# Patient Record
Sex: Male | Born: 1965 | Race: Black or African American | Hispanic: No | Marital: Single
Health system: Southern US, Community
[De-identification: ages and names within clinical notes are randomized; demographics above are authoritative.]

## PROBLEM LIST (undated history)

## (undated) DIAGNOSIS — I10 Essential (primary) hypertension: Secondary | ICD-10-CM

## (undated) DIAGNOSIS — Z950 Presence of cardiac pacemaker: Secondary | ICD-10-CM

---

## 2006-09-30 ENCOUNTER — Observation Stay (HOSPITAL_COMMUNITY): Admission: EM | Admit: 2006-09-30 | Discharge: 2006-10-01 | Payer: Self-pay | Admitting: Emergency Medicine

## 2006-09-30 ENCOUNTER — Ambulatory Visit: Payer: Self-pay | Admitting: Cardiology

## 2006-10-08 ENCOUNTER — Ambulatory Visit: Payer: Self-pay | Admitting: *Deleted

## 2006-10-08 ENCOUNTER — Ambulatory Visit: Payer: Self-pay | Admitting: Internal Medicine

## 2007-10-11 ENCOUNTER — Emergency Department (HOSPITAL_COMMUNITY): Admission: EM | Admit: 2007-10-11 | Discharge: 2007-10-11 | Payer: Self-pay | Admitting: Emergency Medicine

## 2007-10-11 ENCOUNTER — Ambulatory Visit: Payer: Self-pay | Admitting: Internal Medicine

## 2008-03-10 ENCOUNTER — Encounter (INDEPENDENT_AMBULATORY_CARE_PROVIDER_SITE_OTHER): Payer: Self-pay | Admitting: Internal Medicine

## 2008-04-21 ENCOUNTER — Encounter (INDEPENDENT_AMBULATORY_CARE_PROVIDER_SITE_OTHER): Payer: Self-pay | Admitting: Internal Medicine

## 2008-05-10 ENCOUNTER — Observation Stay (HOSPITAL_COMMUNITY): Admission: EM | Admit: 2008-05-10 | Discharge: 2008-05-11 | Payer: Self-pay | Admitting: Emergency Medicine

## 2008-06-15 ENCOUNTER — Emergency Department (HOSPITAL_COMMUNITY): Admission: EM | Admit: 2008-06-15 | Discharge: 2008-06-15 | Payer: Self-pay | Admitting: Family Medicine

## 2008-06-15 ENCOUNTER — Telehealth (INDEPENDENT_AMBULATORY_CARE_PROVIDER_SITE_OTHER): Payer: Self-pay | Admitting: Internal Medicine

## 2008-07-13 ENCOUNTER — Other Ambulatory Visit: Payer: Self-pay | Admitting: Emergency Medicine

## 2008-07-14 ENCOUNTER — Ambulatory Visit: Payer: Self-pay | Admitting: Psychiatry

## 2008-07-14 ENCOUNTER — Inpatient Hospital Stay (HOSPITAL_COMMUNITY): Admission: AD | Admit: 2008-07-14 | Discharge: 2008-07-25 | Payer: Self-pay | Admitting: Psychiatry

## 2008-08-16 ENCOUNTER — Ambulatory Visit: Payer: Self-pay | Admitting: Internal Medicine

## 2008-08-16 DIAGNOSIS — F200 Paranoid schizophrenia: Secondary | ICD-10-CM | POA: Insufficient documentation

## 2008-08-16 DIAGNOSIS — I10 Essential (primary) hypertension: Secondary | ICD-10-CM | POA: Insufficient documentation

## 2008-09-12 ENCOUNTER — Encounter (INDEPENDENT_AMBULATORY_CARE_PROVIDER_SITE_OTHER): Payer: Self-pay | Admitting: Internal Medicine

## 2008-10-03 ENCOUNTER — Ambulatory Visit: Payer: Self-pay | Admitting: Internal Medicine

## 2008-10-03 LAB — CONVERTED CEMR LAB
ALT: 38 units/L (ref 0–53)
AST: 32 units/L (ref 0–37)
Alkaline Phosphatase: 63 units/L (ref 39–117)
BUN: 9 mg/dL (ref 6–23)
Basophils Relative: 0 % (ref 0–1)
Blood in Urine, dipstick: NEGATIVE
CO2: 23 meq/L (ref 19–32)
Chloride: 103 meq/L (ref 96–112)
Cholesterol: 189 mg/dL (ref 0–200)
Glucose, Bld: 89 mg/dL (ref 70–99)
HCT: 44.2 % (ref 39.0–52.0)
HDL: 48 mg/dL (ref >39)
Ketones, urine, test strip: NEGATIVE
Lymphocytes Relative: 59 % — ABNORMAL HIGH (ref 12–46)
MCV: 89.3 fL (ref 78.0–100.0)
Monocytes Relative: 9 % (ref 3–12)
Neutro Abs: 1.5 10*3/uL — ABNORMAL LOW (ref 1.7–7.7)
Nitrite: NEGATIVE
Platelets: 208 10*3/uL (ref 150–400)
Protein, U semiquant: NEGATIVE
RBC: 4.95 M/uL (ref 4.22–5.81)
Sodium: 140 meq/L (ref 135–145)
Total CHOL/HDL Ratio: 3.9
Total Protein: 7.6 g/dL (ref 6.0–8.3)
Triglycerides: 104 mg/dL (ref <150)
WBC: 5.1 10*3/uL (ref 4.0–10.5)
pH: 6.5

## 2008-10-05 ENCOUNTER — Ambulatory Visit: Payer: Self-pay | Admitting: Internal Medicine

## 2008-10-05 LAB — CONVERTED CEMR LAB
Glucose, Urine, Semiquant: NEGATIVE
Nitrite: NEGATIVE
PSA: 0.19 ng/mL (ref 0.10–4.00)
Protein, U semiquant: NEGATIVE
Specific Gravity, Urine: 1.01
Urobilinogen, UA: 1

## 2008-10-12 ENCOUNTER — Encounter (INDEPENDENT_AMBULATORY_CARE_PROVIDER_SITE_OTHER): Payer: Self-pay | Admitting: Internal Medicine

## 2009-09-08 ENCOUNTER — Emergency Department (HOSPITAL_COMMUNITY): Admission: EM | Admit: 2009-09-08 | Discharge: 2009-09-08 | Payer: Self-pay | Admitting: Internal Medicine

## 2009-11-28 ENCOUNTER — Ambulatory Visit: Payer: Self-pay | Admitting: Radiology

## 2009-11-28 ENCOUNTER — Emergency Department (HOSPITAL_BASED_OUTPATIENT_CLINIC_OR_DEPARTMENT_OTHER): Admission: EM | Admit: 2009-11-28 | Discharge: 2009-11-28 | Payer: Self-pay | Admitting: Emergency Medicine

## 2010-07-27 LAB — COMPREHENSIVE METABOLIC PANEL
ALT: 40 U/L (ref 0–53)
BUN: 5 mg/dL — ABNORMAL LOW (ref 6–23)
CO2: 26 mEq/L (ref 19–32)
Calcium: 9.6 mg/dL (ref 8.4–10.5)
Chloride: 105 mEq/L (ref 96–112)
Creatinine, Ser: 1.1 mg/dL (ref 0.4–1.5)
GFR calc Af Amer: >60 mL/min (ref >60)
GFR calc non Af Amer: >60 mL/min (ref >60)
Potassium: 3.8 mEq/L (ref 3.5–5.1)
Total Bilirubin: 1.3 mg/dL — ABNORMAL HIGH (ref 0.3–1.2)
Total Protein: 7.8 g/dL (ref 6.0–8.3)

## 2010-07-27 LAB — URINALYSIS, ROUTINE W REFLEX MICROSCOPIC
Glucose, UA: NEGATIVE mg/dL
Hgb urine dipstick: NEGATIVE
Ketones, ur: NEGATIVE mg/dL

## 2010-07-27 LAB — LIPASE, BLOOD: Lipase: 168 U/L (ref 23–300)

## 2010-08-22 LAB — URINALYSIS, ROUTINE W REFLEX MICROSCOPIC
Bilirubin Urine: NEGATIVE
Glucose, UA: NEGATIVE mg/dL
Hgb urine dipstick: NEGATIVE
Nitrite: NEGATIVE
Specific Gravity, Urine: 1.017 (ref 1.005–1.030)

## 2010-08-22 LAB — POCT I-STAT, CHEM 8
Calcium, Ion: 1.15 mmol/L (ref 1.12–1.32)
Chloride: 104 mEq/L (ref 96–112)
HCT: 42 % (ref 39.0–52.0)
Hemoglobin: 14.3 g/dL (ref 13.0–17.0)
Potassium: 3.4 mEq/L — ABNORMAL LOW (ref 3.5–5.1)
Sodium: 142 mEq/L (ref 135–145)
TCO2: 25 mmol/L (ref 0–100)

## 2010-08-22 LAB — CBC
HCT: 40.3 % (ref 39.0–52.0)
MCV: 88.3 fL (ref 78.0–100.0)
Platelets: 200 10*3/uL (ref 150–400)
RBC: 4.56 MIL/uL (ref 4.22–5.81)
RDW: 13.8 % (ref 11.5–15.5)

## 2010-08-22 LAB — ETHANOL: Alcohol, Ethyl (B): 30 mg/dL — ABNORMAL HIGH (ref 0–10)

## 2010-08-22 LAB — RAPID URINE DRUG SCREEN, HOSP PERFORMED
Benzodiazepines: NOT DETECTED
Opiates: NOT DETECTED
Tetrahydrocannabinol: NOT DETECTED

## 2010-08-22 LAB — DIFFERENTIAL
Basophils Absolute: 0 10*3/uL (ref 0.0–0.1)
Monocytes Relative: 8 % (ref 3–12)
Neutro Abs: 3.7 10*3/uL (ref 1.7–7.7)
Neutrophils Relative %: 48 % (ref 43–77)

## 2010-09-24 NOTE — Consult Note (Signed)
NAME:  Garrett Ortiz, Garrett Ortiz NO.:  [id number]   MEDICAL RECORD NO.:  [id number]          PATIENT TYPE:  EMS   LOCATION:  MAJO                         FACILITY:  [facility]   PHYSICIAN:  Lorre Nick, M.D.    DATE OF BIRTH:  1965-07-28   DATE OF CONSULTATION:  10/11/2007  DATE OF DISCHARGE:  10/11/2007                                 CONSULTATION   REASON FOR CONSULTATION:  Chest pain.   REQUESTING PHYSICIAN:  ER physician, Lorre Nick, MD   HISTORY OF PRESENT ILLNESS:  A 45 year old African American male with  history of hypertrophic obstructive cardiomyopathy status post alcohol  ablation with defibrillator placement in [place] in November 2007 (St.  Jude pacemaker placed) with complaints of atypical chest pain, which he  described as sharp, which comes and goes, over the last 2 days, every 30  minutes to an hour.  He experienced it again today while washing dishes  this morning.  He thought that his pacemaker fired, which was a sharp  pain.  The patient had some mild nausea, mild shortness of breath, and  increased heart rate; however, he does answer yes to any questions I  asked concerning his symptoms.  He saw Dr. Wille Glaser on [facility],  Oct 04, 2007, for recurrent chest discomfort.  The patient was increased  on Toprol 50 mg once a day from Toprol 25 mg once a day per office note  that we have requested to be faxed from Oct 04, 2007.  It was also  stated that Dr. Wille Glaser had his pacemaker checked less than a month  prior to seeing him in the office and had found that it had not fired.  The patient is not very active.  He denies medical noncompliance,  although he does have a history of this.  He denies any cocaine or  alcohol use, although he does have a history of this.   REVIEW OF SYSTEMS:  Positive for chest pain, shortness of breath,  palpitations, GERD symptoms, and nausea.  Otherwise negative.   PAST MEDICAL HISTORY:  1. Supraventricular  tachycardia, nonobstructive CAD per cardiac      catheterization according to notes.  2. Hypertrophic obstructive cardiomyopathy.      a.     Status post alcohol ablation with St. Jude defibrillator       placement at [facility] on March 16, 2006.  3. Schizophrenia.   Most recent echocardiogram dated July 02, 2007 from [facility] revealed an LVEF of 45-50%, estimated RV systolic  pressure 21 mmHg, and estimated right atrial pressure equals 5 mmHg.  Mitral, aortic, tricuspid, pulmonic, and aortic valves were normal.   SOCIAL HISTORY:  He lives in [place] through a temporary housing  facility.  He is not working.  He denies any use of alcohol or cocaine  recently or smoking.  He is not very active.  He is single.   FAMILY HISTORY:  Mother is in good health.  Father with MI, diabetes,  and hypertension.  Siblings with no health problems.   CURRENT MEDICATIONS:  1. Toprol-XL  50 mg daily.  2. Seroquel 300 mg daily.  3. Prolixin 10 mg daily.  4. Prozac 20 mg daily.  5. Aspirin 325 mg daily.  6. Verapamil 240 mg daily.   ALLERGIES:  No known drug allergies.   LABORATORY DATA:  In the ER, hemoglobin 14.8, hematocrit 43.2, white  blood cells 6.2, and platelets 191.  Sodium 142, potassium 4.1, chloride  105, CO2 is not listed, BUN 7, creatinine 0.9, and glucose 99.  Cardiac  enzymes; CK is 30.1, MB 2.1, troponin less than 0.05; PTT 27, PT 12.8,  and INR 0.9.  EKG revealing sinus rhythm with a right bundle-branch  block, ventricular rate of 81 beats per minute.  Chest x-ray revealing  no active disease.   PHYSICAL EXAMINATION:  VITAL SIGNS:  Blood pressure 132/80, pulse 77,  respirations 18, temperature 97.3, and O2 sat 100% on room air.  GENERAL:  He is awake, alert, and oriented with a very flat affect, poor  historian.  HEENT:  Head is normocephalic and atraumatic.  Eyes, PERRLA.  Mucous  membranes of mouth pink and moist.  Tongue is midline.  NECK:   Supple.  There is no JVD.  No carotid bruits appreciated.  CARDIOVASCULAR:  Regular rate and rhythm with 1/6 systolic murmur at the  fourth intercostal space left sternal border.  Pulses are 2+ and equal  bilaterally.  LUNGS:  Clear to auscultation.  ABDOMEN:  Soft and nontender with 2+ bowel sounds.  EXTREMITIES:  Without clubbing, cyanosis, or edema.  NEURO:  Cranial nerves II through XII are grossly intact.   IMPRESSION:  1. Atypical chest pain with normal cardiac enzymes.  EKG is described      as sharp and is reproducible with palpation.  2. History of hypertrophic obstructive cardiomyopathy.  3. History of alcohol ablation status post pacemaker defibrillator      placement, St. Jude in February 2009.  4. Schizophrenia.   PLAN:  The patient has been seen and examined by myself and Dr. Dietrich Pates in the emergency room.  The patient has a history of nonobstructive  coronary artery disease with hypertrophic obstructive cardiomyopathy and  septal ablation, implantable cardioverter-defibrillator pacemaker St.  Jude with complaints of atypical chest pain while washing dishes this  morning.  The patient did see Dr. Wille Glaser on [facility]  approximately one week ago.  The patient has reported palpitations and  he also complains of pain with inspiration.  I do not feel like this is  related to any cardiac issues.  It appears musculoskeletal and it is  probably secondary to defibrillator scar or some nerve  irritation.  The patient is stable.  We will continue his medications as  previously taking before coming in, abstain from drugs and alcohol, and  follow up with Dr. Wille Glaser.  I have called the office, and he will be  seen on October 14, 2007 at 2 p.m. by Dr. Wille Glaser and will be discharged  home.      Bettey Mare. Lyman Bishop, NP    ______________________________  Lorre Nick, M.D.    KML/MEDQ  D:  10/11/2007  T:  10/12/2007  Job:  [id number]   cc:   Corey Harold, MD

## 2010-09-24 NOTE — H&P (Signed)
NAME:  Garrett Ortiz, Garrett Ortiz NO.:  [id number]   MEDICAL RECORD NO.:  [id number]          PATIENT TYPE:  INP   LOCATION:  1854                         FACILITY:  [facility]   PHYSICIAN:  Jonelle Sidle, MD DATE OF BIRTH:  June 29, 1965   DATE OF ADMISSION:  09/30/2006  DATE OF DISCHARGE:                              HISTORY & PHYSICAL   STAT HISTORY AND PHYSICAL   PRIMARY CARDIOLOGIST:  Bonnielee Haff, MD, of [facility] in [facility].   REASON FOR ADMISSION:  History of chest pain and reportedly  supraventricular tachycardia.   HISTORY OF PRESENT ILLNESS:  Garrett Ortiz is a 45 year old male with  limited information indicating a history of hypertrophic obstructive  cardiomyopathy status post previous alcohol septal ablation and  defibrillator placement in [place], [place], back in November  of 2007.  More recently, he has been followed in [facility] by Dr.  Wille Glaser with history of recurrent chest pain and apparently  supraventricular tachycardia and is being considered for a potential  radiofrequency ablation.  Garrett Ortiz came into the [facility]  Emergency Department this evening stating that he has had recurrent  chest pain and a feeling of a rapid heart rate intermittently over the  last three days.  He reports that he was unable to get a ride to [employer].  I called the cardiologist on call with [facility] to obtain more information, although it was after their  office had closed, and no records were no available.  We were able to  ascertain that the patient was supposed to be taking Verapamil-ER 240 mg  daily and Toprol-XL 25 mg daily after calling a local [employer], although  the patient states that he has not taken these medicines for at least  the last two months sighting problems with finances.  On my examination,  the patient denied any active chest pain.  His electrocardiogram shows a  right  bundle branch block pattern/intraventricular conduction delay with  increased voltage, but relatively decreased septal forces in V1 and V2.  Nonspecific ST T-wave changes are also noted predominantly in the  inferior leads.  His initial point-of-care cardiac markers were normal  with a troponin-I level less than 0.05, and his chest x-ray demonstrated  mild cardiomegaly with evidence of a defibrillator in place, but no  frank pulmonary edema.  He denies having any recent problems with  breathlessness or syncope and otherwise reports being in his usual state  of health.   ALLERGIES:  No known drug allergies.   PRESENT MEDICATIONS OR SUPPOSED TO BE:  1. Verapamil-ER 240 mg p.o. daily.  2. Toprol-XL 25 mg p.o. daily.  3. Seroquel-XR 300 mg p.o. q.h.s.  He does report that he has been      taking the Seroquel regularly.   PAST MEDICAL HISTORY:  1. As outlined above.  2. Also apparently includes schizophrenia based on his present      Seroquel.  3. Previous reconstructive right knee surgery.  4. Previous cardiac catheterization in 2004 in [place] based on  patient's description, apparently showing no significant coronary      artery disease.  The patient reports that he had a stress test in      February of this year in [facility] that was also reassuring.   SOCIAL HISTORY:  The patient is single, he lives in [place], he has a  significant other here with him this evening.  He denies any tobacco or  alcohol use.  He is presently not working.   FAMILY HISTORY:  Significant for myocardial infarction in the patient's  father at age 6.  The patient states that there is no family history of  hypertrophic cardiomyopathy, however.   REVIEW OF SYSTEMS:  As described in the History of Present Illness.  Otherwise, negative.   PHYSICAL EXAMINATION:  VITAL SIGNS:  Blood pressure of 138/78, heart  rate is in the 70s, the patient is afebrile, nonhypoxic, and with  nonlabored  respirations.  GENERAL:  This is a normally nourished-appearing male in no acute  distress.  HEENT:  Conjunctivae was normal, oropharynx is clear.  NECK:  Supple, no elevated jugulovenous pressure, no bruits, no  thyromegaly is noted.  LUNGS:  Generally clear without labored breathing at rest.  THORAX:  Examination of the thorax reveals a well-healed device pocket  in the left upper thorax without erythema, drainage, or tenderness to  palpation.  CARDIAC:  Regular rate and rhythm with a prominent S4, split first heart  sound, and soft systolic murmur without radiation.  There is no S3  gallop.  ABDOMEN:  Soft and nontender, normoactive bowel sounds.  EXTREMITIES:  No significant pitting edema, distal pulses are 2+.  SKIN:  Warm and dry.  MUSCULOSKELETAL:  No kyphosis is noted.  NEUROPSYCHIATRIC:  The patient is alert and oriented x3.  Affect is  somewhat flat.   INITIAL LABORATORY DATA:  WBC is 6.1, hemoglobin 15.7, hematocrit 46.3,  platelets 218.  Sodium 140, potassium 3.8, chloride 108, BUN 7,  creatinine 1.2.  Point-of-care CK-MB 3.2, point-of-care troponin-I less  than 0.05, point-of-care myoglobin 78.   IMPRESSION:  1. Recent history of intermittent chest pain and palpitations with      available information indicating a diagnosis of recurrent      supraventricular tachycardia that has been managed by Dr. Wille Glaser      of [facility] in [facility].  The patient was      supposed to be taking both Verapamil-SR and Toprol-XL as outlined      above but has not over the last two months, and he has had      recurrent symptoms over the last three days.  He presented to [facility] Emergency Department given transportation      difficulties in getting to [facility].  At the present time, he is      in sinus rhythm and without active symptoms.  I do not have     complete information on this gentleman at hand.  My understanding      is that he has  had a reassuring evaluation in terms of his coronary      anatomy, and the patient does report having a reassuring stress      test from February of this year in [employer].  He has had no      recent breathlessness or syncope.  2. History of hypertrophic obstructive cardiomyopathy, status post      alcohol septal ablation and defibrillator placement  in [place],      [place], back in November of 2007.  No other details are      available.  3. A presumed history of schizophrenia, on Seroquel.   PLAN:  I discussed the situation with the patient, his significant  other, and also Dr. Dot Been with [facility] on call  this evening.  Our plan will be to admit Mr. Vink for overnight  observation on telemetry.  We will follow up cardiac markers and place  him back on Verapamil-SR 240 mg daily along with Toprol-XL 25 mg daily.  I would anticipate that if he remains stable and his evaluation is  reassuring, he could be  discharged tomorrow with planned followup with [facility].  We will plan to place a phone call to their office tomorrow  and make arrangements.  Would plan on generic prescriptions for Mr.  Ra to help with financial constraints.  Further plans to follow.      Jonelle Sidle, MD  Electronically Signed     SGM/MEDQ  D:  09/30/2006  T:  09/30/2006  Job:  [id number]   cc:   Bonnielee Haff, M.D.

## 2010-09-24 NOTE — Discharge Summary (Signed)
NAME:  Garrett Ortiz, Garrett Ortiz NO.:  [id number]   MEDICAL RECORD NO.:  [id number]          PATIENT TYPE:  INP   LOCATION:  4731                         FACILITY:  [facility]   PHYSICIAN:  Garrett Sidle, MD DATE OF BIRTH:  [birth year]   DATE OF ADMISSION:  09/30/2006  DATE OF DISCHARGE:  10/01/2006                               DISCHARGE SUMMARY   PRIMARY CARDIOLOGIST:  Dr. Wille Ortiz, [facility] in  [place], [place].   PRINCIPAL DIAGNOSIS:  Chest pain.   SECONDARY DIAGNOSES:  1. History of supraventricular tachycardia.  2. Reported history of nonobstructive coronary artery disease.  3. Hypertrophic obstructive cardiomyopathy.  4. Status post alcohol ablation and defibrillator placement in      [place] in November 2007.   ALLERGIES:  NO KNOWN DRUG ALLERGIES.   PROCEDURE:  None.   HISTORY OF PRESENT ILLNESS:  A 45 year old male with prior history of  hypertrophic obstructive cardiomyopathy currently followed at [facility] in [place], [place], who was in his  usual state of health until approximately three days prior to admission  when he began to note intermittent chest discomfort with sensation of  rapid heartbeat.  He has been off of his medications, which previously  included verapamil ER and Toprol XL, for two months and presented to  [facility] Emergency Department on Sep 30, 2006, with recurrent  symptoms.  In the ED, his ECG was without any acute changes and his  troponins were negative.  He was admitted to telemetry for further  evaluation.   HOSPITAL COURSE:  Garrett Ortiz had slightly elevated CK-MBs to a max of  4.7, however, had normal CKs, normal relative index, and normal  troponins.  He was reinitiated on his verapamil ER 240 mg daily as well  as Toprol XL 25 mg daily and has not had any episodes of clear PSVT.  We  are planning to discharge him home today and have contacted [facility] in [place], [place], to arrange a follow-up  appointment with him in the next day or so.  They are going to try and  squeeze him in for tomorrow and will contact him later this afternoon.   DISCHARGE LABORATORY DATA:  Hemoglobin 14.5, hematocrit 42.2, wbc 6.1,  platelets 163, MCV 91.7.  Sodium 142, potassium 3.4, chloride 106, CO2  28, BUN 5, creatinine 0.95, glucose 111.  PT 13.1, INR 1, PTT 26.  Total  bilirubin 1.3, alkaline phosphatase 74, AST 53, ALT 60, albumin 3.  CK  192, MB 4.7, troponin I 0.06, calcium 8.6.   DISPOSITION:  Patient is being discharged home today in good condition.   FOLLOW UP PLANS AND APPOINTMENTS:  Dr. Deedra Ortiz office will contact  him later this afternoon with a scheduled appointment.   DISCHARGE MEDICATIONS:  1. Verapamil ER 240 mg daily.  2. Toprol XL 25 mg daily.  3. Seroquel XR 300 mg nightly.   OUTSTANDING LABORATORY STUDIES:  None.   DURATION OF DISCHARGE ENCOUNTER:  32 minutes including physician time.      Nicolasa Ducking, ANP  Garrett Sidle, MD  Electronically Signed    CB/MEDQ  D:  10/01/2006  T:  10/01/2006  Job:  [id number]

## 2010-09-24 NOTE — H&P (Signed)
NAME:  Garrett Ortiz, Garrett Ortiz NO.:  [id number]   MEDICAL RECORD NO.:  [id number]          PATIENT TYPE:  IPS   LOCATION:  0405                          [facility]ACILITY[facility]  [facility]   PHYSICIAN:  Vic Ripper, P.A.-C.DATE OF BIRTH:  24-Sep-1965   DATE OF ADMISSION:  07/14/2008  DATE OF DISCHARGE:                       PSYCHIATRIC ADMISSION ASSESSMENT   This is a voluntary admission to the services of Dr. Geralyn Flash.   IDENTIFYING INFORMATION:  This is a 45 year old single African American  male.  He apparently he reported to the ED at [facility]. He reported  that he has had increasing depression over the last 2 weeks. He states  he ran out of his medicine and has not taken any in 3 weeks.  This would  be concurrent with him having left his group home placement.  He admits  to having thoughts of suicide. No plan.  He denies homicidal ideation.  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   PAST PSYCHIATRIC HISTORY:  He is currently followed at [facility] with Dr. Mila Homer.   SOCIAL HISTORY:  He is a high Garment/textile technologist in 1985.  He has never  married.  He has a son 16, a daughter 20.  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   FAMILY HISTORY:  He states his second cousin has schizophrenia.   ALCOHOL AND DRUG HISTORY:  He reports that this is a one-time relapse on  alcohol and cocaine. His primary care Johnisha Louks is a Dr. Julieanne Manson.   MEDICAL PROBLEMS:  He has a history for hypertrophic obstructive  cardiomyopathy treated with alcohol ablation of the septum. He has also  had a St. Jude implantable cardioverter defibrillator placed in 2006  down in [place]. He was most recently admitted to the [facility] December 30-31, 2009 to be evaluated for atypical  chest pain.   MEDICATIONS:  He is currently not taking any but his last prescribed  drugs which we will reinstitute are fluphenazine decanoate 25 mg take 50  mg q. 2 weeks.  Will have to call [facility] to see  when he last received that. Prolixin 20 mg at 9:00 p.m. Prozac 20 mg  p.o. daily, metoprolol 25 mg p.o. daily, benztropine 1 mg b.i.d.   DRUG ALLERGIES:  He has no known drug allergies.   POSITIVE PHYSICAL FINDINGS:  He was medically cleared in the ED at [facility]. As already stated he did have some substances. His  alcohol level was 30.  His UDS was positive for cocaine.  His urinalysis  was clear with no abnormal findings as was his CBC. His electrolytes  showed his potassium to be slightly low at 3.4.  His BUN was low at 3,  and he had no other abnormal findings on his lab work.   Vital signs on admission to our unit show that he is 70.5 inches  tall.  His weight is 217. Temperature is 97.9, blood pressure is 119/83 to  120/86, pulse ranged from 83-80. Respirations are 16.   He has numerous tattoos which are well indicated on the anatomic  drawing, and he has had a pacemaker implanted.   MENTAL STATUS EXAM:  Today he is alert and oriented.  He is casually  dressed in hospital gown.  He is otherwise adequately groomed and  nourished.  His speech is soft and slow.  His mood is depressed.  His  affect is congruent.  His thought processes are clear, rational and goal  oriented.  He states he is here to get his medications restarted and to  go back to the group home that he left.  Judgment and insight are fair.  Concentration and memory are intact superficially. Intelligence is  average.  He is not suicidal or homicidal.  He is not responding to  auditory or visual hallucinations.   DIAGNOSIS:  AXIS I:  Paranoid schizophrenia with recent noncompliance  with meds due to leaving group home 3 weeks ago.  AXIS II:  Deferred.  AXIS III:  History for hypertrophic  obstructive cardiomyopathy treated  with prior alcohol ablation of the septum. He also has had an  implantable cardioverter defibrillator implanted in 2006 down in [place]. This was to treat syncope.  AXIS IV:  At least moderate. He is reporting bereavement over the death  of a relative in a motor vehicle accident and relapsing on substances.  AXIS V:  30.   The plan is to restart his meds and once he is stable he can return to  his prior group home placement at [facility].  Mrs. Melvyn Neth  is the caretaker there and she can be reached at [phone] or [phone].  Estimated length of stay is 3-5 days.      Vic Ripper, P.A.-C.     MD/MEDQ  D:  07/15/2008  T:  07/16/2008  Job:  [id number]

## 2010-09-24 NOTE — H&P (Signed)
NAME:  Garrett Ortiz, Garrett Ortiz NO.:  [id number]   MEDICAL RECORD NO.:  [id number]          PATIENT TYPE:  INP   LOCATION:  2014                         FACILITY:  [facility]   PHYSICIAN:  Cassell Clement, M.D. DATE OF BIRTH:  August 16, 1965   DATE OF ADMISSION:  05/10/2008  DATE OF DISCHARGE:                              HISTORY & PHYSICAL   HISTORY:  This is a 45 year old African American gentleman who presents  as an unassigned patient at [facility] Emergency Room with a history of  several hours of chest pain earlier today.  He has an interesting past  medical history.  He states that he has a past history of hypertrophic  obstructive cardiomyopathy and in 2003, he underwent alcohol ablation of  his septum at [facility].  He states that in 2006,  he underwent implantation of an ICD at [facility].  It is a Engineer, water. Jude  device.  He states his last episode of syncope was in 2007.  The patient  is on no cardiac meds.  Review of his old records from [facility]  indicate that he was to have been on Toprol.  The patient states he is  on no other medicine at the present time other than Zyprexa of uncertain  dose for bipolar illness.  He [facility].  Today while showering, the patient had onset of  left chest discomfort with some left arm radiation.  He did not have any  nausea, vomiting, dizziness, or syncope.  Duration of the pain was  several hours when he came to the emergency room where his EKG was  abnormal because of the presence of atrial pacing and a right bundle  branch block, but there are no acute ischemic ST-T wave changes.  Of  note is the fact that the patient states that in the past, he has had  cardiac catheterization which showed nonobstructive coronary artery  disease.  In the emergency room, he has been stable with no recurrent  chest pain and his cardiac enzymes initially have been normal.   The  family history reveals his father is living, but has had heart  trouble.  Mother is living and well.   SOCIAL HISTORY:  He has not worked since 2006 when he had his ICD  implanted.  He has filed for disability.  He is presently on Medicaid.  He denies alcohol or tobacco usage.  He denies any use of street drugs.  Previous old records indicate that in the past he has abused cocaine.   The patient denies any known allergies.   REVIEW OF SYSTEMS:  Otherwise negative.  He denies any change in bowel  habits, hematochezia, melena.  There is no genitourinary symptoms.  He  denies cough or sputum production.  He is not having any headaches.  All  other systems negative in detail.   PHYSICAL EXAMINATION:  VITAL SIGNS:  His blood pressure is 131/84, pulse  is 76 and he is atrial paced.  The pacing spike can be well seen on his  bedside  monitor, although it does not show up in his 12-lead EKG.  GENERAL:  Well-developed, well-nourished African American gentleman  lying flat in no distress.  SKIN:  Warm and dry.  HEENT:  Pupils equal and reactive.  Sclerae clear.  Mouth and pharynx  normal.  NECK:  Carotids normal.  Jugular venous pressure normal.  Thyroid  normal.  CHEST:  Clear.  He is tender to palpation over the left chest.  He has  an ICD in the left upper chest under the left clavicle.  HEART:  No murmur, gallop, rub, or click.  ABDOMEN:  Soft without hepatosplenomegaly or masses.  EXTREMITIES:  No cyanosis, clubbing, or edema.  There is no phlebitis.  NEUROLOGIC:  Physiologic.  SKIN:  No rash.   His EKG shows atrial pacing in a right bundle-branch block pattern.  Chest x-ray shows normal heart size, ICD in place, no heart failure.   LABORATORY DATA:  CK-MB of less than 1.0, troponin less than 0.05.  Urine screen is negative for drugs of abuse.  Sodium 138, potassium 3.6,  chloride 106, blood sugar 90, BUN 5, creatinine 1.04.  CBC shows a  hemoglobin of 14.2, white count 4200,  platelet count 173,000.   IMPRESSION:  1. Atypical chest pain possibly chest wall discomfort.  2. History of a hypertrophic obstructive cardiomyopathy treated with      previous alcohol ablation of the septum.  3. History of St. Jude implantable cardioverter-defibrillator.  4. Bipolar illness.   DISPOSITION:  He is being admitted overnight for observation to  telemetry.  Anticipate, we will be able to send him home tomorrow if he  rules out and getting back into the care of his cardiologist, Dr.  Wille Glaser.  We will get serial enzymes.  We will restart empiric beta-  blocker, use nitrates p.r.n., and add daily aspirin.  We will continue  his Zyprexa.  He is not sure of what dose he is on and we will go with  10 mg daily.           ______________________________  Cassell Clement, M.D.     TB/MEDQ  D:  05/10/2008  T:  05/11/2008  Job:  [id number]   cc:   Dr. Wille Glaser

## 2010-09-24 NOTE — Discharge Summary (Signed)
NAME:  Garrett Ortiz, Garrett Ortiz NO.:  [id number]   MEDICAL RECORD NO.:  [id number]          PATIENT TYPE:  INP   LOCATION:  2014                         FACILITY:  [facility]   PHYSICIAN:  Cassell Clement, M.D. DATE OF BIRTH:  January 19, 1966   DATE OF ADMISSION:  05/10/2008  DATE OF DISCHARGE:  05/11/2008                               DISCHARGE SUMMARY   HISTORY:  This is a 45 year old African American gentleman admitted to  the emergency room on observation status.  He is an unassigned patient  who presents with chest pain.  He has an interesting past medical  history.  He has a history of hypertrophic obstructive cardiomyopathy  which was treated with alcohol ablation at [facility] in 2003.  In 2006 he underwent ICD implant with a St. Jude  device at [facility] because of repeated spells of syncope.  His last  syncopal episode was in 2007.  The patient stated on arrival that he was  on no cardiac meds.  However, later his medication list was brought to  the hospital, and it showed that he has been taking a beta blocker.  The  patient is followed for bipolar illness at the mental health clinic.  The patient stated that he was taking Zyprexa.  However, the list which  was subsequently brought to the hospital indicates that he was on  several other psychotropic medications but not Zyprexa.   PHYSICAL EXAMINATION:  His blood pressure is 131/84, pulse of 76, and he  has atrial pacing.  HEAD AND NECK:  Unremarkable.  CHEST:  Clear.  HEART:  Reveals no murmur, gallop or rub.  The patient was tender to  palpation in the left chest.  He has an ICD in the upper left chest.  ABDOMEN:  Soft and nontender.  EXTREMITIES:  Show no phlebitis or edema.   His EKG shows right bundle branch block and atrial pacing.  Chest x-ray  showed normal heart size, no CHF.   The initial impression was atypical chest pain, but it was felt  reasonable to watch him overnight and get  serial enzymes.  Serial  enzymes were obtained, and showed no evidence of myocardial ischemia.  His hemoglobin on admission was 14, white count 4200.  Sodium 140,  potassium 3.6, BUN 5, creatinine 1.04, blood sugar 90.  Troponins were  0.01 and 0.03.  CK-MBs were 1.6, 1.6, and 1.6.  Cholesterol was 143, LDL  86, HDL 45.  Drugs of abuse screen was negative. B natriuretic peptide  was 212.   HOSPITAL COURSE:  The patient was observed overnight on telemetry.  He  had no further chest pain.  The chest wall tenderness had improved by  the following morning.  By 12:31 a.m. it was felt that he was safe to be  discharged home.  He will be continued on his same medications from  mental health, and he will continue taking Toprol XL 25 mg a day for  beta blocker.  We are adding aspirin 81 mg daily and adding sublingual  nitroglycerin 1/150 p.r.n., and he may also use  ibuprofen 200 mg 1 twice  a day if needed for musculoskeletal pain.  The patient will follow up as  scheduled with Dr. Wille Glaser at [facility] in [facility] for  his cardiology needs.  He will follow up with mental health for follow-  up with his bipolar illness.   CONDITION ON DISCHARGE:  Improved.           ______________________________  Cassell Clement, M.D.     TB/MEDQ  D:  05/11/2008  T:  05/11/2008  Job:  [id number]   cc:   [facility], M.D.

## 2010-09-27 NOTE — Discharge Summary (Signed)
NAME:  LORRAINE, TERRIQUEZ NO.:  [id number]   MEDICAL RECORD NO.:  [id number]          PATIENT TYPE:  IPS   LOCATION:  0405                          [facility]ACILITY[facility]  [facility]   PHYSICIAN:  Anselm Jungling, MD  DATE OF BIRTH:  June 25, 1965   DATE OF ADMISSION:  07/14/2008  DATE OF DISCHARGE:  07/25/2008                               DISCHARGE SUMMARY   IDENTIFYING DATA AND REASON FOR ADMISSION:  This was in inpatient  psychiatric admission for Garrett Ortiz, a 45 year old single African American  male with a long history of paranoid schizophrenia, currently living in  a group home.  He was admitted due to decompensation related to  medication noncompliance.  Please refer to the admission note for  further details pertaining to the symptoms, circumstances and history  that led to his hospital admission.   MEDICAL AND LABORATORY:  He came to [place] with a history of hypertrophic  obstructive cardiomyopathy that has previously been treated with an  ablation procedure.  He also has had an implantable cardioverter  defibrillator.  His most recent medical admission was at [facility] in  December, and evaluation for chest pain.  At the time of his admission  to our psychiatric service, he was not taking any of his prescribed  medications.  He was restarted on Toprol XL 25 mg daily.  There were no  acute medical issues during this stay.   HOSPITAL COURSE:  The patient was admitted to the adult inpatient  psychiatric service.  He presented as a well-nourished, normally-  developed adult male who was extremely withdrawn, anxious, and unable  and/or unwilling to give much in the way of any history.  His thoughts  appeared to be disorganized.  His mood was depressed.  However, he  understood the situation that he was in and was agreeable to staying for  treatment.  He was restarted on his usual regimen of Seroquel, Prolixin,  and Prolixin Decanoate, with Prozac 20 mg daily in addition, and  Cogentin 1  mg b.i.d. for side effects.   Holdyn remained reserved, withdrawn, and guarded through much of his  stay.  However, when approached, he would generally indicate fairly  readily the degree of symptoms he was experiencing, mostly auditory  hallucinations.  At one point, about 7 days into his stay we were  prepared to discharge him, but he let us know that he was still having  extremely distressing auditory hallucinations, and he requested that his  medications be increased, and his stay the extended period.  We did  increase his Seroquel dosage at that point, and a few days later he felt  ready for discharge.  He agreed to the following aftercare plan.   AFTERCARE:  The patient was to follow up at [facility] with an appointment to see their psychiatrist on March 25 at  11:30 a.m.   DISCHARGE MEDICATIONS:  1. Toprol XL 25 mg daily.  2. Prozac 20 mg daily.  3. Cogentin 1 mg b.i.d.  4. Prolixin 10 mg q. a.m. and 20 mg q.h.s.  5. Prolixin  50 mg decanoate IM q. 2 weeks, next due August 08, 2008.  6. Seroquel 800 mg q.h.s.   DISCHARGE DIAGNOSES:  AXIS I:  Schizophrenia, chronic paranoid type,  acute exacerbation, resolving.  AXIS II:  Deferred.  AXIS III:  History of cardiomyopathy, hypertension.  AXIS IV:  Stressors severe.  AXIS V:  GAF on discharge 55.      Anselm Jungling, MD  Electronically Signed     SPB/MEDQ  D:  07/26/2008  T:  07/26/2008  Job:  [id number]

## 2011-02-06 LAB — PROTIME-INR
INR: 0.9
Prothrombin Time: 12.8

## 2011-02-06 LAB — DIFFERENTIAL
Basophils Absolute: 0
Eosinophils Relative: 1
Lymphs Abs: 2.5
Neutro Abs: 2.2
Neutrophils Relative %: 42 — ABNORMAL LOW

## 2011-02-06 LAB — POCT I-STAT, CHEM 8
BUN: 7
Creatinine, Ser: 0.9
Glucose, Bld: 99
HCT: 45
Hemoglobin: 15.3
Potassium: 4.1

## 2011-02-06 LAB — RAPID URINE DRUG SCREEN, HOSP PERFORMED
Amphetamines: NOT DETECTED
Benzodiazepines: NOT DETECTED
Cocaine: NOT DETECTED
Opiates: NOT DETECTED

## 2011-02-06 LAB — POCT CARDIAC MARKERS
CKMB, poc: 2.1
Operator id: 151321

## 2011-02-06 LAB — CBC
HCT: 43.2
MCHC: 34.3
MCV: 90.2
Platelets: 191
WBC: 5.2

## 2011-02-14 LAB — CARDIAC PANEL(CRET KIN+CKTOT+MB+TROPI)
CK, MB: 1.6 ng/mL (ref 0.3–4.0)
Relative Index: 1.1 (ref 0.0–2.5)
Total CK: 142 U/L (ref 7–232)
Total CK: 148 U/L (ref 7–232)
Total CK: 165 U/L (ref 7–232)
Troponin I: 0.02 ng/mL (ref 0.00–0.06)

## 2011-02-14 LAB — LIPID PANEL
Cholesterol: 143 mg/dL (ref 0–200)
HDL: 45 mg/dL (ref >39)
Total CHOL/HDL Ratio: 3.2 RATIO

## 2011-02-14 LAB — BASIC METABOLIC PANEL
BUN: 5 mg/dL — ABNORMAL LOW (ref 6–23)
Chloride: 106 mEq/L (ref 96–112)
GFR calc Af Amer: >60 mL/min (ref >60)
Glucose, Bld: 90 mg/dL (ref 70–99)
Potassium: 3.6 mEq/L (ref 3.5–5.1)
Sodium: 140 mEq/L (ref 135–145)

## 2011-02-14 LAB — CK TOTAL AND CKMB (NOT AT ARMC): CK, MB: 1.6 ng/mL (ref 0.3–4.0)

## 2011-02-14 LAB — DIFFERENTIAL
Basophils Relative: 1 % (ref 0–1)
Eosinophils Absolute: 0.1 10*3/uL (ref 0.0–0.7)
Monocytes Absolute: 0.4 10*3/uL (ref 0.1–1.0)
Monocytes Relative: 9 % (ref 3–12)
Neutro Abs: 1.8 10*3/uL (ref 1.7–7.7)

## 2011-02-14 LAB — CBC
HCT: 42.4 % (ref 39.0–52.0)
Hemoglobin: 14.2 g/dL (ref 13.0–17.0)
MCHC: 33.6 g/dL (ref 30.0–36.0)
MCV: 91.7 fL (ref 78.0–100.0)
RBC: 4.62 MIL/uL (ref 4.22–5.81)

## 2011-02-14 LAB — RAPID URINE DRUG SCREEN, HOSP PERFORMED
Amphetamines: NOT DETECTED
Opiates: NOT DETECTED

## 2011-02-14 LAB — POCT CARDIAC MARKERS
CKMB, poc: <1 ng/mL — ABNORMAL LOW (ref 1.0–8.0)
CKMB, poc: <1 ng/mL — ABNORMAL LOW (ref 1.0–8.0)
Myoglobin, poc: 29.6 ng/mL (ref 12–200)

## 2013-11-12 ENCOUNTER — Emergency Department (HOSPITAL_COMMUNITY)
Admission: EM | Admit: 2013-11-12 | Discharge: 2013-11-13 | Disposition: A | Discharge status: Home / Self Care | Payer: Medicaid Other | Attending: Emergency Medicine | Admitting: Emergency Medicine

## 2013-11-12 ENCOUNTER — Encounter (HOSPITAL_COMMUNITY): Payer: Self-pay | Admitting: Emergency Medicine

## 2013-11-12 ENCOUNTER — Emergency Department (HOSPITAL_COMMUNITY): Payer: Medicaid Other

## 2013-11-12 DIAGNOSIS — Z7982 Long term (current) use of aspirin: Secondary | ICD-10-CM | POA: Diagnosis not present

## 2013-11-12 DIAGNOSIS — R079 Chest pain, unspecified: Secondary | ICD-10-CM | POA: Diagnosis present

## 2013-11-12 DIAGNOSIS — R071 Chest pain on breathing: Secondary | ICD-10-CM | POA: Diagnosis not present

## 2013-11-12 DIAGNOSIS — Z79899 Other long term (current) drug therapy: Secondary | ICD-10-CM | POA: Diagnosis not present

## 2013-11-12 DIAGNOSIS — Z950 Presence of cardiac pacemaker: Secondary | ICD-10-CM | POA: Insufficient documentation

## 2013-11-12 DIAGNOSIS — R0789 Other chest pain: Secondary | ICD-10-CM

## 2013-11-12 DIAGNOSIS — I10 Essential (primary) hypertension: Secondary | ICD-10-CM | POA: Diagnosis not present

## 2013-11-12 DIAGNOSIS — I421 Obstructive hypertrophic cardiomyopathy: Secondary | ICD-10-CM | POA: Insufficient documentation

## 2013-11-12 HISTORY — DX: Presence of cardiac pacemaker: Z95.0

## 2013-11-12 HISTORY — DX: Essential (primary) hypertension: I10

## 2013-11-12 LAB — COMPREHENSIVE METABOLIC PANEL
ALBUMIN: 3.8 g/dL (ref 3.5–5.2)
ALK PHOS: 71 U/L (ref 39–117)
ALT: 66 U/L — AB (ref 0–53)
AST: 88 U/L — AB (ref 0–37)
Anion gap: 15 (ref 5–15)
BILIRUBIN TOTAL: 0.9 mg/dL (ref 0.3–1.2)
BUN: 17 mg/dL (ref 6–23)
CHLORIDE: 103 meq/L (ref 96–112)
CO2: 24 meq/L (ref 19–32)
Calcium: 10.3 mg/dL (ref 8.4–10.5)
Creatinine, Ser: 0.97 mg/dL (ref 0.50–1.35)
GFR calc Af Amer: >90 mL/min (ref >90)
Glucose, Bld: 99 mg/dL (ref 70–99)
POTASSIUM: 3.9 meq/L (ref 3.7–5.3)
SODIUM: 142 meq/L (ref 137–147)
Total Protein: 7.8 g/dL (ref 6.0–8.3)

## 2013-11-12 LAB — CBC
HEMATOCRIT: 45.5 % (ref 39.0–52.0)
HEMOGLOBIN: 16.1 g/dL (ref 13.0–17.0)
MCH: 32.7 pg (ref 26.0–34.0)
MCHC: 35.4 g/dL (ref 30.0–36.0)
MCV: 92.3 fL (ref 78.0–100.0)
Platelets: 159 10*3/uL (ref 150–400)
RBC: 4.93 MIL/uL (ref 4.22–5.81)
RDW: 13.2 % (ref 11.5–15.5)
WBC: 6.1 10*3/uL (ref 4.0–10.5)

## 2013-11-12 LAB — I-STAT TROPONIN, ED: TROPONIN I, POC: 0.04 ng/mL (ref 0.00–0.08)

## 2013-11-12 MED ORDER — LORAZEPAM 2 MG/ML IJ SOLN
1.0000 mg | Freq: Once | INTRAMUSCULAR | Status: AC
  Administered 2013-11-12: 1 mg via INTRAVENOUS
  Filled 2013-11-12: qty 1

## 2013-11-12 MED ORDER — KETOROLAC TROMETHAMINE 30 MG/ML IJ SOLN
30.0000 mg | Freq: Once | INTRAMUSCULAR | Status: AC
  Administered 2013-11-12: 30 mg via INTRAVENOUS
  Filled 2013-11-12: qty 1

## 2013-11-12 NOTE — ED Notes (Signed)
Patient arrives to ED with c/o left side CP that radiates to the left arm, left jaw and also with c/o headache, weakness Patient states that he has an AICD Patient describes pain as "pressure." Patient alert and oriented x 4 upon arrival

## 2013-11-12 NOTE — ED Provider Notes (Addendum)
CSN: [id number]     Arrival date & time 11/12/13  2158 History   First MD Initiated Contact with Patient 11/12/13 2301     Chief Complaint  Patient presents with  . Chest Pain     (Consider location/radiation/quality/duration/timing/severity/associated sxs/prior Treatment) Patient is a 48 y.o. male presenting with chest pain. The history is provided by the patient.  Chest Pain Pain location:  L chest Pain quality: dull   Pain radiates to:  L arm Pain radiates to the back: no   Pain severity:  Mild Onset quality:  Gradual Duration:  10 hours Timing:  Constant Progression:  Unchanged Chronicity:  New Context: movement   Relieved by:  Nothing Ineffective treatments:  Aspirin Associated symptoms: no fever, no near-syncope, no numbness and no palpitations    Pain constant and worse with movement and better with remaining still--no recent injury, denies rashes--states feels like 'soreness'  Past Medical History  Diagnosis Date  . Hypertension   . Pacemaker    History reviewed. No pertinent past surgical history. History reviewed. No pertinent family history. History  Substance Use Topics  . Smoking status: Not on file  . Smokeless tobacco: Not on file  . Alcohol Use: No    Review of Systems  Constitutional: Negative for fever.  Cardiovascular: Positive for chest pain. Negative for palpitations and near-syncope.  Neurological: Negative for numbness.  All other systems reviewed and are negative.     Allergies  Review of patient's allergies indicates no known allergies.  Home Medications   Prior to Admission medications   Medication Sig Start Date End Date Taking? Authorizing Provider  aspirin EC 81 MG tablet Take 81 mg by mouth daily.   Yes Historical Provider, MD  metoprolol tartrate (LOPRESSOR) 25 MG tablet Take 25 mg by mouth 2 (two) times daily.   Yes Historical Provider, MD   BP 164/102  Pulse 64  Temp(Src) 98.6 F (37 C) (Oral)  Resp 19  Ht 5\' 11"  (1.803  m)  Wt 260 lb (117.935 kg)  BMI 36.28 kg/m2  SpO2 100% Physical Exam  Nursing note and vitals reviewed. Constitutional: He is oriented to person, place, and time. He appears well-developed and well-nourished.  Non-toxic appearance. No distress.  HENT:  Head: Normocephalic and atraumatic.  Eyes: Conjunctivae, EOM and lids are normal. Pupils are equal, round, and reactive to light.  Neck: Normal range of motion. Neck supple. No tracheal deviation present. No mass present.  Cardiovascular: Normal rate, regular rhythm and normal heart sounds.  Exam reveals no gallop.   No murmur heard. Pulmonary/Chest: Effort normal and breath sounds normal. No stridor. No respiratory distress. He has no decreased breath sounds. He has no wheezes. He has no rhonchi. He has no rales. He exhibits tenderness. He exhibits no crepitus.    Abdominal: Soft. Normal appearance and bowel sounds are normal. He exhibits no distension. There is no tenderness. There is no rebound and no CVA tenderness.  Musculoskeletal: Normal range of motion. He exhibits no edema and no tenderness.  Neurological: He is alert and oriented to person, place, and time. He has normal strength. No cranial nerve deficit or sensory deficit. GCS eye subscore is 4. GCS verbal subscore is 5. GCS motor subscore is 6.  Skin: Skin is warm and dry. No abrasion and no rash noted.  Psychiatric: He has a normal mood and affect. His speech is normal and behavior is normal.    ED Course  Procedures (including critical care time) Labs Review Labs  Reviewed  CBC  COMPREHENSIVE METABOLIC PANEL  URINALYSIS, ROUTINE W REFLEX MICROSCOPIC  I-STAT TROPOININ, ED    Imaging Review Dg Chest 2 View  11/12/2013   CLINICAL DATA:  Chest pain, hypertension.  EXAM: CHEST  2 VIEW  COMPARISON:  11/18/2011  FINDINGS: Left pacer/ AICD in place with leads in the right atrium and right ventricle. Heart is upper limits normal in size. Lungs are clear. No effusions or edema. No  acute bony abnormality.  IMPRESSION: No active cardiopulmonary disease.   Electronically Signed   By: Charlett NoseKevin  Dover M.D.   On: 11/12/2013 23:32     EKG Interpretation   Date/Time:  Saturday November 12 2013 22:03:52 EDT Ventricular Rate:  68 PR Interval:  184 QRS Duration: 139 QT Interval:  443 QTC Calculation: 471 R Axis:   -136 Text Interpretation:  Sinus arrhythmia Multiform ventricular premature  complexes Prominent P waves, nondiagnostic Nonspecific intraventricular  conduction delay Borderline repolarization abnormality No significant  change since last tracing Confirmed by Ibtisam Benge  MD, Avis Tirone ([id number]) on  11/12/2013 11:10:19 PM      MDM   Final diagnoses:  None    Patient given medications for chest wall pain and feels better at this time. Patient reexamined after meds and is stable for discharge. Patient had a negative stress test a month ago according to him. Do not think that this represents ACS or pulmonary. No neurological findings noted.    Toy BakerAnthony T Hitesh Fouche, MD 11/13/13 [id number]  Toy BakerAnthony T Stamatia Masri, MD 11/13/13 [phone]

## 2013-11-13 MED ORDER — METAXALONE 800 MG PO TABS: 800.0000 mg | ORAL_TABLET | Freq: Three times a day (TID) | ORAL | Status: AC

## 2013-11-13 MED ORDER — OXYCODONE-ACETAMINOPHEN 5-325 MG PO TABS: 2.0000 | ORAL_TABLET | ORAL | Status: AC | PRN

## 2013-11-13 MED ORDER — IBUPROFEN 600 MG PO TABS: 600.0000 mg | ORAL_TABLET | Freq: Four times a day (QID) | ORAL | Status: AC | PRN

## 2013-11-13 NOTE — ED Notes (Signed)
Requested patient to urinate. 

## 2013-11-13 NOTE — ED Notes (Signed)
Patient was not able to urinate and doctor was fine with not getting the specimen

## 2013-11-13 NOTE — Discharge Instructions (Signed)

## 2013-11-13 NOTE — Progress Notes (Signed)
[facility]

## 2013-11-16 NOTE — Progress Notes (Signed)
  CARE MANAGEMENT ED NOTE 11/16/2013  Patient:  Garrett Ortiz,Garrett Ortiz   Account Number:  [id number]  Date Initiated:  11/16/2013  Documentation initiated by:  Ferdinand CavaSCHETTINO,Mcadoo Muzquiz  Subjective/Objective Assessment:   Recieved phone call from the patient stating that he is unable to afford a discharge medication     Subjective/Objective Assessment Detail:     Action/Plan:   Action/Plan Detail:   Anticipated DC Date:       Status Recommendation to Physician:   Result of Recommendation:  Agreed    DC Planning Services  Other    Choice offered to / List presented to:            Status of service:  Completed, signed off  ED Comments:   ED Comments Detail:  CM spoke with the patient regarding [facility]. The patient stated that he was able to fill the Percocet and the ibuprofen but the Skelaxin was not covered by Medicaid. This CM stated that we did not have him listed as a Medicaid patient and he stated that [place] [facility] which [place] not yet [place] to [place] so he goes to [place] to get his prescriptions filled. This CM [facility] [phone] and spoke with pharmacist Budd PalmerQuazie and he stated that the patient did fill the prescriptions for the Percocet and the ibuprofen which were covered by Medicaid but they did not have a script for Skelaxin on hand. This CM then spoke with the patient again and he stated that the pharmacist had run it through and stated that it was not covered by Medicaid and would cost $98. This CM explained that we could not fax another script to the pharmacy unless the pharmacist cancels the previous script. The patient stated that he understood [facility]. Explained to bring the script and have the pharmacy call and we can fax a script for a covered medication to the pharmacy. The patient verbalized understanding and had no further questions or concerns.

## 2013-11-19 NOTE — Progress Notes (Addendum)
CARE MANAGEMENT NOTE 11/19/2013  Patient:  Garrett Ortiz,[name]   Account Number:  [id number]  Date Initiated:  11/19/2013  Documentation initiated by:  [facility]AVIS,Tayra Dawe  Subjective/Objective Assessment:     Action/Plan:   Anticipated DC Date:  11/13/2013   Anticipated DC Plan:  HOME/SELF CARE      DC Planning Services  CM consult      Choice offered to / List presented to:             Status of service:  Completed, signed off Medicare Important Message given?   (If response is "NO", the following Medicare IM given date fields will be blank) Date Medicare IM given:   Medicare IM given by:   Date Additional Medicare IM given:   Additional Medicare IM given by:    Discharge Disposition:  HOME/SELF CARE  Per UR Regulation:    If discussed at Long Length of Stay Meetings, dates discussed:    Comments:  11/19/2013 3:26 PM NCM contacted Dr. Denton LankSteinl in [place] [facility] for follow up on Skelaxin. Advised to continue with his pain medications as prescribed. And to follow up with PCP. NCM advised pt he can pay pt can pay out of pocket for Skelaxin and to continue taking Motrin and Pecocet as prescribed. Also to follow up with his PCP. Contacted [facility] and spoke to pharmacist with update on medication that no alternate Rx will be called in for pt. Garrett DonningAlesia Nga Rabon RN CCM Case Mgmt phone [phone]    11/19/2013 3:06 PM N[facility]. Stating Skelaxin in not a preferred medication for Medicaid. Requesting Rx for another muscle relaxant covered by insurance. NCM contacted Ed Flow Mgr, Carollee HerterShannon. States she will discuss with Ed MD. NCM contacted pt and states he is [place] Ort[place] currently for a family function and to pick up medications. Pt states he has not transferred his Medicaid to [place] in the last year. He has not follow up with a physician for the pain he was having on his left side. [facility] [place] [facility]. [facility] [phone]. Spoke to Harvey CedarsQuazie.  States Flexeril is on the $4.00 list. Garrett DonningAlesia Gaila Engebretsen RN CCM Case Mgmt

## 2013-11-19 NOTE — Progress Notes (Signed)
[facility] Stating Skelaxin in not a preferred medication for Medicaid. Requesting Rx for another muscle relaxant covered by insurance. NCM contacted Ed Flow Mgr, Carollee HerterShannon. States she will discuss with Ed MD. NCM contacted pt and states he is [place] currently for a family function and to pick up medications. Pt states he has not transferred his Medicaid to [place] in the last year. He has not follow up with a physician for the pain he was having on his left side. [facility] [place] [facility]. [facility] [phone]. Spoke to HamiltonQuazie. States Flexeril is on the $4.00 list. Isidoro DonningAlesia Ryszard Socarras RN CCM Case Mgmt

## 2015-11-24 IMAGING — CR DG CHEST 2V
2 series · 2 of 2 positions shown · non-contrast
Comparison: 11/18/2011

CLINICAL DATA: Chest pain, hypertension.

EXAM:
CHEST  2 VIEW

[w chest pa]
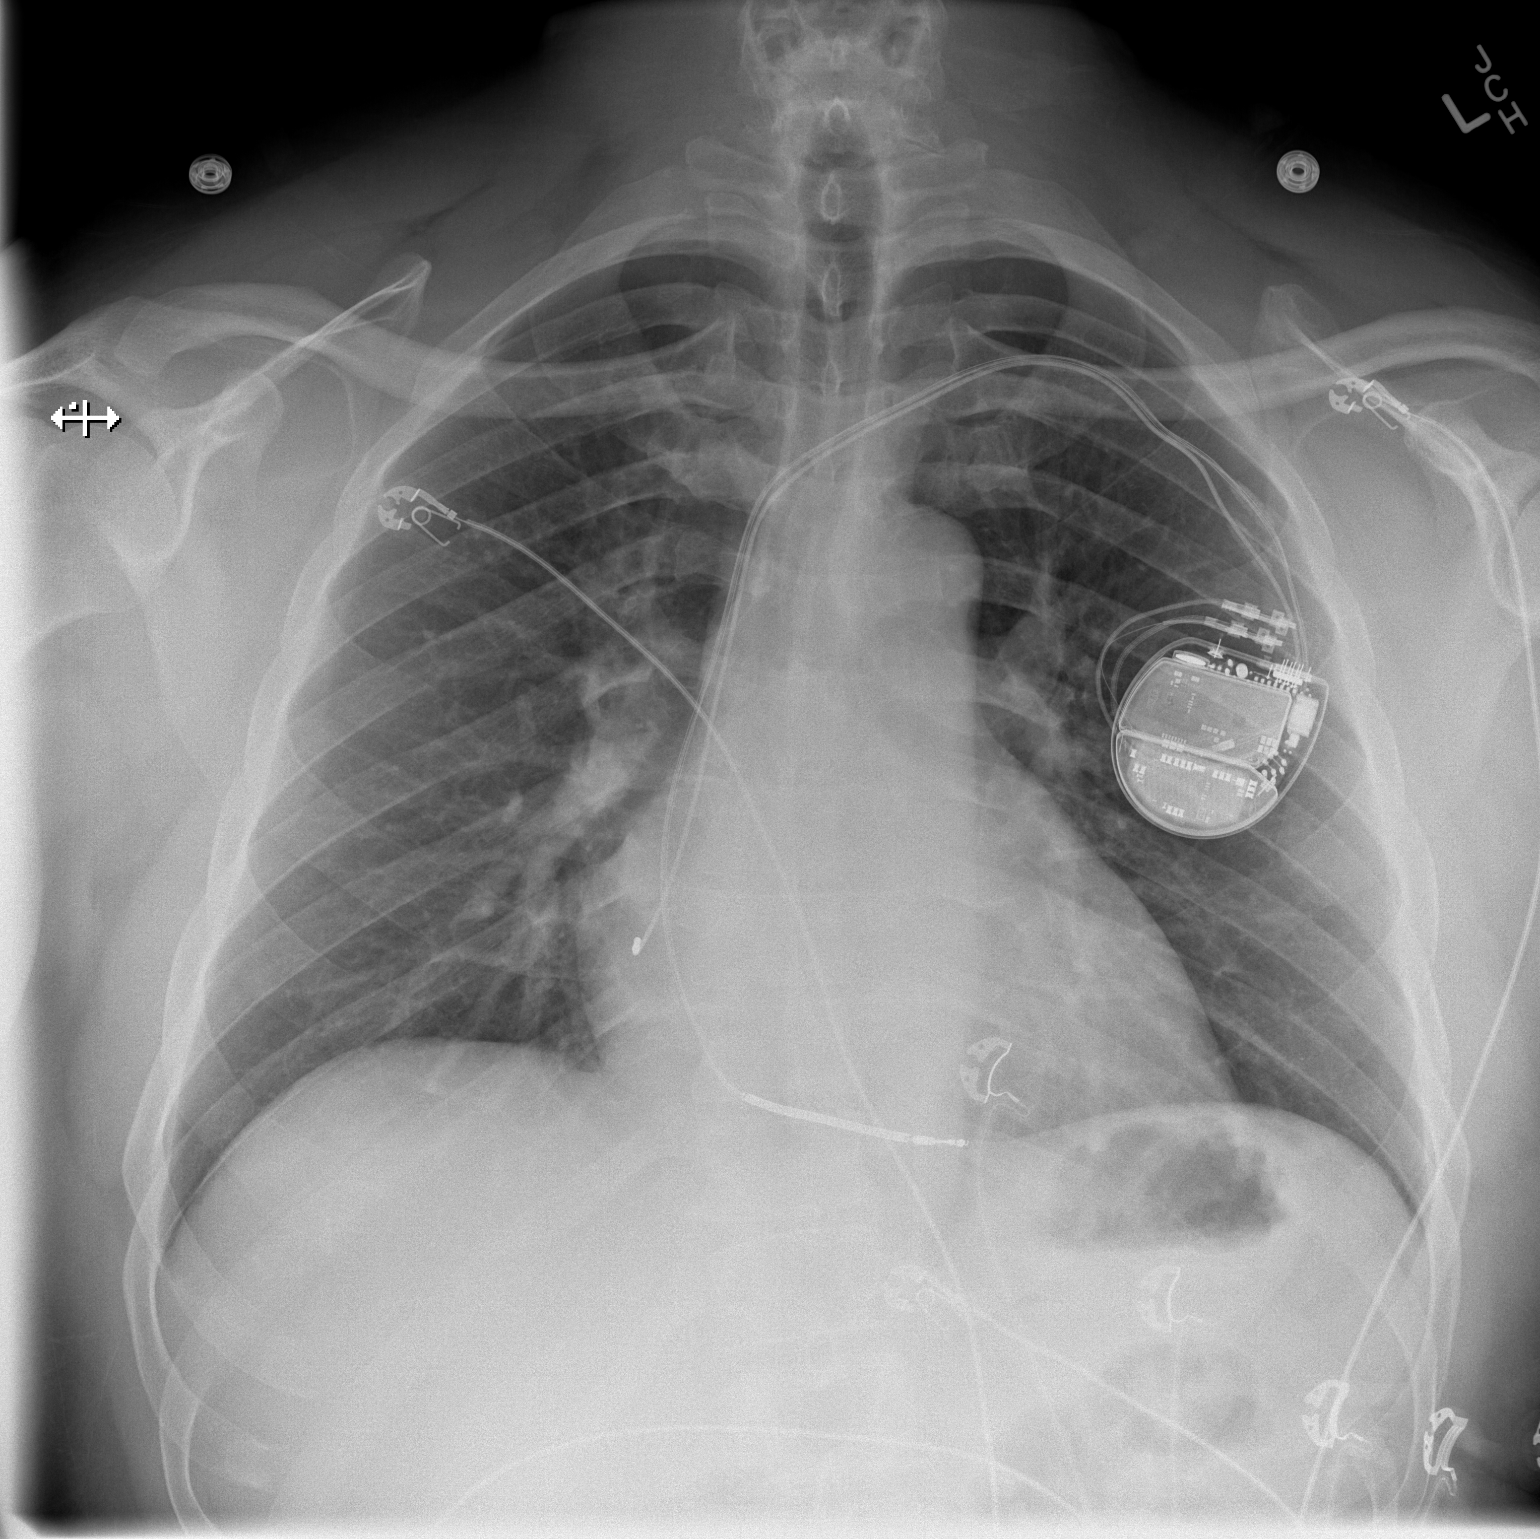

[w chest lat]
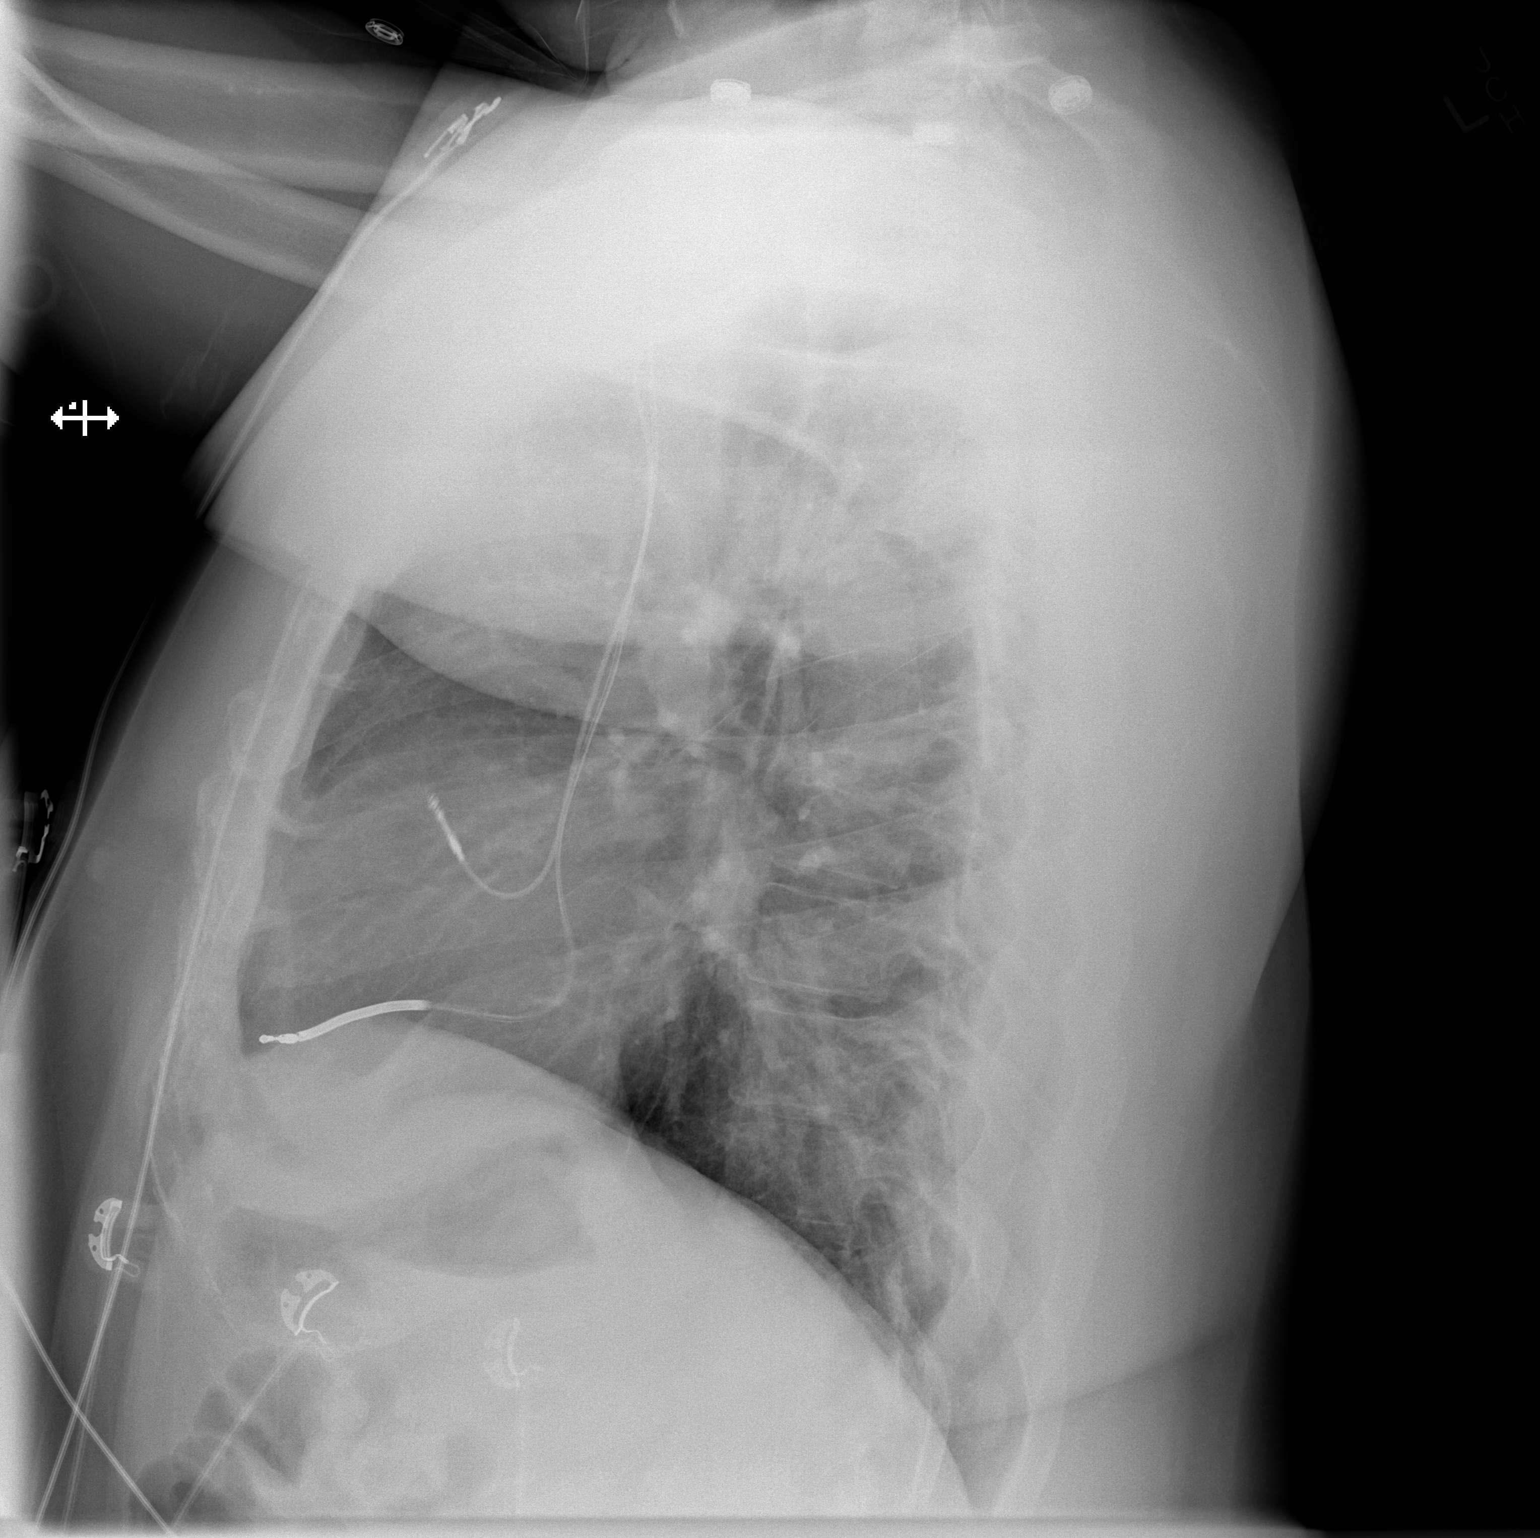

[2 of 2 positions shown; findings below may reference images not displayed]

FINDINGS: Left pacer/ AICD in place with leads in the right atrium and right
ventricle. Heart is upper limits normal in size. Lungs are clear. No
effusions or edema. No acute bony abnormality.
IMPRESSION: No active cardiopulmonary disease.
